# Patient Record
Sex: Male | Born: 1990 | Race: White | Hispanic: No | Marital: Single | Smoking: Former smoker
Health system: Southern US, Community
[De-identification: ages and names within clinical notes are randomized; demographics above are authoritative.]

## PROBLEM LIST (undated history)

## (undated) DIAGNOSIS — F329 Major depressive disorder, single episode, unspecified: Secondary | ICD-10-CM

## (undated) DIAGNOSIS — F909 Attention-deficit hyperactivity disorder, unspecified type: Secondary | ICD-10-CM

## (undated) DIAGNOSIS — F32A Depression, unspecified: Secondary | ICD-10-CM

---

## 2008-09-07 ENCOUNTER — Emergency Department (HOSPITAL_COMMUNITY): Admission: EM | Admit: 2008-09-07 | Discharge: 2008-09-07 | Payer: Self-pay | Admitting: Emergency Medicine

## 2010-11-18 ENCOUNTER — Emergency Department (HOSPITAL_COMMUNITY)
Admission: EM | Admit: 2010-11-18 | Discharge: 2010-11-18 | Disposition: A | Discharge status: Home / Self Care | Payer: Managed Care, Other (non HMO) | Attending: Emergency Medicine | Admitting: Emergency Medicine

## 2010-11-18 DIAGNOSIS — F17200 Nicotine dependence, unspecified, uncomplicated: Secondary | ICD-10-CM | POA: Insufficient documentation

## 2010-11-18 DIAGNOSIS — R059 Cough, unspecified: Secondary | ICD-10-CM | POA: Insufficient documentation

## 2010-11-18 DIAGNOSIS — J40 Bronchitis, not specified as acute or chronic: Secondary | ICD-10-CM | POA: Insufficient documentation

## 2010-11-18 DIAGNOSIS — F988 Other specified behavioral and emotional disorders with onset usually occurring in childhood and adolescence: Secondary | ICD-10-CM | POA: Insufficient documentation

## 2010-11-18 DIAGNOSIS — R05 Cough: Secondary | ICD-10-CM | POA: Insufficient documentation

## 2012-10-28 ENCOUNTER — Emergency Department (HOSPITAL_COMMUNITY): Payer: Self-pay

## 2012-10-28 ENCOUNTER — Encounter (HOSPITAL_COMMUNITY): Payer: Self-pay

## 2012-10-28 ENCOUNTER — Emergency Department (HOSPITAL_COMMUNITY)
Admission: EM | Admit: 2012-10-28 | Discharge: 2012-10-28 | Disposition: A | Discharge status: Home / Self Care | Payer: Self-pay | Attending: Emergency Medicine | Admitting: Emergency Medicine

## 2012-10-28 DIAGNOSIS — R61 Generalized hyperhidrosis: Secondary | ICD-10-CM | POA: Insufficient documentation

## 2012-10-28 DIAGNOSIS — IMO0001 Reserved for inherently not codable concepts without codable children: Secondary | ICD-10-CM | POA: Insufficient documentation

## 2012-10-28 DIAGNOSIS — J029 Acute pharyngitis, unspecified: Secondary | ICD-10-CM

## 2012-10-28 DIAGNOSIS — R110 Nausea: Secondary | ICD-10-CM

## 2012-10-28 DIAGNOSIS — Z8659 Personal history of other mental and behavioral disorders: Secondary | ICD-10-CM | POA: Insufficient documentation

## 2012-10-28 DIAGNOSIS — R109 Unspecified abdominal pain: Secondary | ICD-10-CM

## 2012-10-28 DIAGNOSIS — R197 Diarrhea, unspecified: Secondary | ICD-10-CM

## 2012-10-28 DIAGNOSIS — H53149 Visual discomfort, unspecified: Secondary | ICD-10-CM | POA: Insufficient documentation

## 2012-10-28 DIAGNOSIS — R51 Headache: Secondary | ICD-10-CM | POA: Insufficient documentation

## 2012-10-28 DIAGNOSIS — M791 Myalgia, unspecified site: Secondary | ICD-10-CM

## 2012-10-28 HISTORY — DX: Attention-deficit hyperactivity disorder, unspecified type: F90.9

## 2012-10-28 LAB — CBC WITH DIFFERENTIAL/PLATELET
Basophils Absolute: 0 10*3/uL (ref 0.0–0.1)
Basophils Relative: 0 % (ref 0–1)
Hemoglobin: 15.6 g/dL (ref 13.0–17.0)
Lymphocytes Relative: 9 % — ABNORMAL LOW (ref 12–46)
MCHC: 34.3 g/dL (ref 30.0–36.0)
Monocytes Relative: 6 % (ref 3–12)
Neutro Abs: 12.9 10*3/uL — ABNORMAL HIGH (ref 1.7–7.7)
Neutrophils Relative %: 83 % — ABNORMAL HIGH (ref 43–77)
RBC: 5.21 MIL/uL (ref 4.22–5.81)
WBC: 15.6 10*3/uL — ABNORMAL HIGH (ref 4.0–10.5)

## 2012-10-28 LAB — URINALYSIS, ROUTINE W REFLEX MICROSCOPIC
Glucose, UA: NEGATIVE mg/dL
Specific Gravity, Urine: 1.02 (ref 1.005–1.030)

## 2012-10-28 LAB — COMPREHENSIVE METABOLIC PANEL
ALT: 26 U/L (ref 0–53)
AST: 25 U/L (ref 0–37)
CO2: 23 mEq/L (ref 19–32)
Calcium: 9.2 mg/dL (ref 8.4–10.5)
Sodium: 133 mEq/L — ABNORMAL LOW (ref 135–145)
Total Protein: 8.1 g/dL (ref 6.0–8.3)

## 2012-10-28 LAB — MONONUCLEOSIS SCREEN: Mono Screen: NEGATIVE

## 2012-10-28 LAB — URINE MICROSCOPIC-ADD ON

## 2012-10-28 MED ORDER — KETOROLAC TROMETHAMINE 60 MG/2ML IM SOLN
60.0000 mg | Freq: Once | INTRAMUSCULAR | Status: AC
  Administered 2012-10-28: 60 mg via INTRAMUSCULAR
  Filled 2012-10-28: qty 2

## 2012-10-28 MED ORDER — OSELTAMIVIR PHOSPHATE 75 MG PO CAPS: 75.0000 mg | ORAL_CAPSULE | Freq: Two times a day (BID) | ORAL | Status: DC

## 2012-10-28 MED ORDER — MENTHOL 3 MG MT LOZG
1.0000 | LOZENGE | Freq: Once | OROMUCOSAL | Status: AC
  Administered 2012-10-28: 3 mg via ORAL
  Filled 2012-10-28: qty 9

## 2012-10-28 NOTE — ED Provider Notes (Signed)
History     CSN: [id number]  Arrival date & time 10/28/12  1412   First MD Initiated Contact with Patient 10/28/12 1414      Chief Complaint  Patient presents with  . Generalized Body Aches    HPI Comments: 22 y.o presents with symptoms which started immediately yesterday night.  Body aches, sore throat, abdominal pain (pressure like 4/10 worse with movement), diarrhea.  He also has had more tonsil stones coming up. Symptoms have been present for 1 day.  This am he noted diarrhea.  He has been taking Immodium for diarrhea.  One sick contact (aunt in law who possibly had a cold) . He has not had the flu shot and neither has his pregnant wife  Also mentions recent nose bleeds within last 2 weeks and he has been spiting with specks of blood.   PMH: epistaxis, ADHD  P[facility]: exploratory abdominal surgery (child); colonoscopy age 78 or 1  SH: married; wife pregnant with twins; patient has been cleaning cat liter, works as a Electrical engineer   The history is provided by the patient. No language interpreter was used.    Past Medical History  Diagnosis Date  . ADHD (attention deficit hyperactivity disorder)     History reviewed. No pertinent past surgical history.  No family history on file.  History  Substance Use Topics  . Smoking status: Not on file  . Smokeless tobacco: Not on file  . Alcohol Use: No      Review of Systems  Constitutional: Positive for diaphoresis. Negative for fever, chills and appetite change.       Felt hot   HENT: Positive for sore throat.   Eyes: Positive for photophobia.  Respiratory: Negative for cough.   Gastrointestinal: Positive for nausea, abdominal pain and diarrhea. Negative for vomiting.       Abdominal pain feels like presssure 4/10.    Diarrhea x 1.5 months before sudden onset today  Musculoskeletal: Positive for myalgias.       Shoulders neck (posterior and anterior) sore.   Neurological: Positive for headaches.  All other systems  reviewed and are negative.    Allergies  Review of patient's allergies indicates no known allergies.  Home Medications   Current Outpatient Rx  Name  Route  Sig  Dispense  Refill  . loperamide (IMODIUM) 1 MG/5ML solution   Oral   Take 2 mg by mouth 4 (four) times daily as needed for diarrhea or loose stools.         Marland Kitchen oseltamivir (TAMIFLU) 75 MG capsule   Oral   Take 1 capsule (75 mg total) by mouth 2 (two) times daily.   10 capsule   0     Generic okay     BP 134/80  Pulse 92  Temp(Src) 98.5 F (36.9 C) (Oral)  Resp 19  SpO2 99%  Physical Exam  Nursing note and vitals reviewed. Constitutional: He is oriented to person, place, and time. Vital signs are normal. He appears well-developed and well-nourished. He is cooperative.  HENT:  Head: Normocephalic and atraumatic.  Mouth/Throat: Oropharynx is clear and moist and mucous membranes are normal. No oropharyngeal exudate.  Eyes: Conjunctivae are normal. Pupils are equal, round, and reactive to light. Right eye exhibits no discharge. Left eye exhibits no discharge. No scleral icterus.  Mild photophobia   Neck: Normal range of motion and full passive range of motion without pain.  Cardiovascular: Normal rate, regular rhythm, S1 normal, S2 normal and normal heart  sounds.   No murmur heard. Pulmonary/Chest: Effort normal and breath sounds normal.  Abdominal: Soft. Bowel sounds are normal. He exhibits no distension. There is no tenderness.  Obese ab  Musculoskeletal: He exhibits no edema.  Lymphadenopathy:       Head (right side): No submental, no submandibular, no tonsillar, no preauricular, no posterior auricular and no occipital adenopathy present.       Head (left side): No submental, no submandibular, no tonsillar, no preauricular, no posterior auricular and no occipital adenopathy present.    He has no cervical adenopathy.  Neurological: He is alert and oriented to person, place, and time. He has normal strength.   Skin: Skin is warm, dry and intact. No rash noted.  Psychiatric: He has a normal mood and affect. His speech is normal and behavior is normal. Judgment and thought content normal. Cognition and memory are normal.    ED Course  Procedures (including critical care time)  Labs Reviewed  CBC WITH DIFFERENTIAL - Abnormal; Notable for the following:    WBC 15.6 (*)    Neutrophils Relative 83 (*)    Neutro Abs 12.9 (*)    Lymphocytes Relative 9 (*)    All other components within normal limits  COMPREHENSIVE METABOLIC PANEL - Abnormal; Notable for the following:    Sodium 133 (*)    Glucose, Bld 100 (*)    All other components within normal limits  URINALYSIS, ROUTINE W REFLEX MICROSCOPIC - Abnormal; Notable for the following:    Hgb urine dipstick TRACE (*)    Ketones, ur TRACE (*)    All other components within normal limits  RAPID STREP SCREEN  MONONUCLEOSIS SCREEN  LIPASE, BLOOD  URINE MICROSCOPIC-ADD ON  RAPID HIV SCREEN ([facility])  TOXOPLASMA ANTIBODIES- IGG AND  IGM   Dg Abd Acute W/chest  10/28/2012  *RADIOLOGY REPORT*  Clinical Data: Generalized body aches and diarrhea.  ACUTE ABDOMEN SERIES (ABDOMEN 2 VIEW & CHEST 1 VIEW)  Comparison: None.  Findings: Frontal view of the chest shows midline trachea and normal heart size.  Lungs are clear.  No pleural fluid.  Two views of the abdomen show gas and stool scattered in the colon. There is a relative paucity of small bowel gas.  IMPRESSION: No acute findings.   Original Report Authenticated By: Leanna Battles, M.D.      1. Sore throat   2. Abdominal pain   3. Nausea   4. Diarrhea   5. Myalgia       MDM  Ddx: viral, strep, mono, flu,  less likely toxoplasmosis  CBC, CMET, rapid strep, lipase, mono, UA, HIV, toxo antibodies  Tamiflu 75 mg bid x 5 days    Shirlee Latch MD [phone]        Annett Gula, MD 10/28/12 [phone]

## 2012-10-28 NOTE — ED Provider Notes (Signed)
I saw and evaluated the patient, reviewed the resident's note and I agree with the findings and plan.  Body aches, weakness, sore throat, diarrhea since last night. Intermittent diarrhea for the past month. Nontoxic, afebrile, no meningismus. nonfocal neuro exam. Abdomen soft. Maculopapular erythema to lower back and abdomen. 5/5 strength in bilateral lower extremities. Ankle plantar and dorsiflexion intact. Great toe extension intact bilaterally. +2 DP and PT pulses. +2 patellar reflexes bilaterally. Normal gait.   Glynn Octave, MD 10/28/12 [phone]

## 2012-10-28 NOTE — ED Notes (Signed)
Pt c/o bodyaches, sore throat x2 days

## 2013-06-12 ENCOUNTER — Emergency Department (HOSPITAL_BASED_OUTPATIENT_CLINIC_OR_DEPARTMENT_OTHER)
Admission: EM | Admit: 2013-06-12 | Discharge: 2013-06-12 | Disposition: A | Discharge status: Home / Self Care | Payer: No Typology Code available for payment source | Attending: Emergency Medicine | Admitting: Emergency Medicine

## 2013-06-12 ENCOUNTER — Encounter (HOSPITAL_BASED_OUTPATIENT_CLINIC_OR_DEPARTMENT_OTHER): Payer: Self-pay | Admitting: Emergency Medicine

## 2013-06-12 DIAGNOSIS — S139XXA Sprain of joints and ligaments of unspecified parts of neck, initial encounter: Secondary | ICD-10-CM | POA: Insufficient documentation

## 2013-06-12 DIAGNOSIS — F3289 Other specified depressive episodes: Secondary | ICD-10-CM | POA: Insufficient documentation

## 2013-06-12 DIAGNOSIS — S161XXA Strain of muscle, fascia and tendon at neck level, initial encounter: Secondary | ICD-10-CM

## 2013-06-12 DIAGNOSIS — Y9389 Activity, other specified: Secondary | ICD-10-CM | POA: Insufficient documentation

## 2013-06-12 DIAGNOSIS — X500XXA Overexertion from strenuous movement or load, initial encounter: Secondary | ICD-10-CM | POA: Insufficient documentation

## 2013-06-12 DIAGNOSIS — F909 Attention-deficit hyperactivity disorder, unspecified type: Secondary | ICD-10-CM | POA: Insufficient documentation

## 2013-06-12 DIAGNOSIS — Y929 Unspecified place or not applicable: Secondary | ICD-10-CM | POA: Insufficient documentation

## 2013-06-12 DIAGNOSIS — F329 Major depressive disorder, single episode, unspecified: Secondary | ICD-10-CM | POA: Insufficient documentation

## 2013-06-12 DIAGNOSIS — Z79899 Other long term (current) drug therapy: Secondary | ICD-10-CM | POA: Insufficient documentation

## 2013-06-12 HISTORY — DX: Depression, unspecified: F32.A

## 2013-06-12 HISTORY — DX: Major depressive disorder, single episode, unspecified: F32.9

## 2013-06-12 MED ORDER — CYCLOBENZAPRINE HCL 10 MG PO TABS: 10.0000 mg | ORAL_TABLET | Freq: Three times a day (TID) | ORAL | Status: AC | PRN

## 2013-06-12 MED ORDER — IBUPROFEN 800 MG PO TABS: 800.0000 mg | ORAL_TABLET | Freq: Three times a day (TID) | ORAL | Status: AC

## 2013-06-12 NOTE — ED Notes (Signed)
Patient states that he was twisting his neck two days ago to relieve muscle stress and it started hurting and continues to hurt. Took tylenol no relief

## 2013-06-12 NOTE — ED Provider Notes (Signed)
CSN: [id number]     Arrival date & time 06/12/13  2130 History   First MD Initiated Contact with Patient 06/12/13 [phone]     Chief Complaint  Patient presents with  . Neck Pain   (Consider location/radiation/quality/duration/timing/severity/associated sxs/prior Treatment) Patient is a 22 y.o. male presenting with neck pain.  Neck Pain  Pt reports two days ago after 'cracking' his neck he had onset of moderate constant aching pain in neck, worse with movement and worsening over the last 2 days. Denies any radicular symptoms. Minimal improvement with APAP.   Past Medical History  Diagnosis Date  . ADHD (attention deficit hyperactivity disorder)   . Depression    History reviewed. No pertinent past surgical history. No family history on file. History  Substance Use Topics  . Smoking status: Never Smoker   . Smokeless tobacco: Not on file  . Alcohol Use: No    Review of Systems  Musculoskeletal: Positive for neck pain.   All other systems reviewed and are negative except as noted in HPI.   Allergies  Review of patient's allergies indicates no known allergies.  Home Medications   Current Outpatient Rx  Name  Route  Sig  Dispense  Refill  . amphetamine-dextroamphetamine (ADDERALL XR) 25 MG 24 hr capsule   Oral   Take 25 mg by mouth every morning.         Marland Kitchen buPROPion (WELLBUTRIN) 100 MG tablet   Oral   Take 100 mg by mouth 2 (two) times daily.         Marland Kitchen lamoTRIgine (LAMICTAL) 150 MG tablet   Oral   Take 150 mg by mouth 2 (two) times daily.         Marland Kitchen loperamide (IMODIUM) 1 MG/5ML solution   Oral   Take 2 mg by mouth 4 (four) times daily as needed for diarrhea or loose stools.         Marland Kitchen oseltamivir (TAMIFLU) 75 MG capsule   Oral   Take 1 capsule (75 mg total) by mouth 2 (two) times daily.   10 capsule   0     Generic okay    BP 160/104  Pulse 94  Temp(Src) 98.9 F (37.2 C) (Oral)  Resp 19  SpO2 100% Physical Exam  Nursing note and vitals  reviewed. Constitutional: He is oriented to person, place, and time. He appears well-developed and well-nourished.  HENT:  Head: Normocephalic and atraumatic.  Eyes: EOM are normal. Pupils are equal, round, and reactive to light.  Neck: Normal range of motion. Neck supple.  Cardiovascular: Normal rate, normal heart sounds and intact distal pulses.   Pulmonary/Chest: Effort normal and breath sounds normal.  Abdominal: Bowel sounds are normal. He exhibits no distension. There is no tenderness.  Musculoskeletal: Normal range of motion. He exhibits tenderness (soft tissue tenderness of cervical and upper thoracic areas). He exhibits no edema.  Neurological: He is alert and oriented to person, place, and time. He has normal strength. No cranial nerve deficit or sensory deficit.  Skin: Skin is warm and dry. No rash noted.  Psychiatric: He has a normal mood and affect.    ED Course  Procedures (including critical care time) Labs Review Labs Reviewed - No data to display Imaging Review No results found.  EKG Interpretation   None       MDM   1. Cervical strain, acute, initial encounter     Neck pain, no radicular symptoms and normal neuro exam. Suspect muscle spasm, less likely  disk disease. Advised Motrin/Flexeril. PCP followup if not improving.    Charles B. Bernette Mayers, MD 06/12/13 [phone]

## 2013-09-19 IMAGING — CR DG ABDOMEN ACUTE W/ 1V CHEST
3 series · 3 of 3 positions shown · non-contrast
Comparison: None.

CLINICAL DATA: Generalized body aches and diarrhea.

ACUTE ABDOMEN SERIES (ABDOMEN 2 VIEW & CHEST 1 VIEW)

[w chest pa]
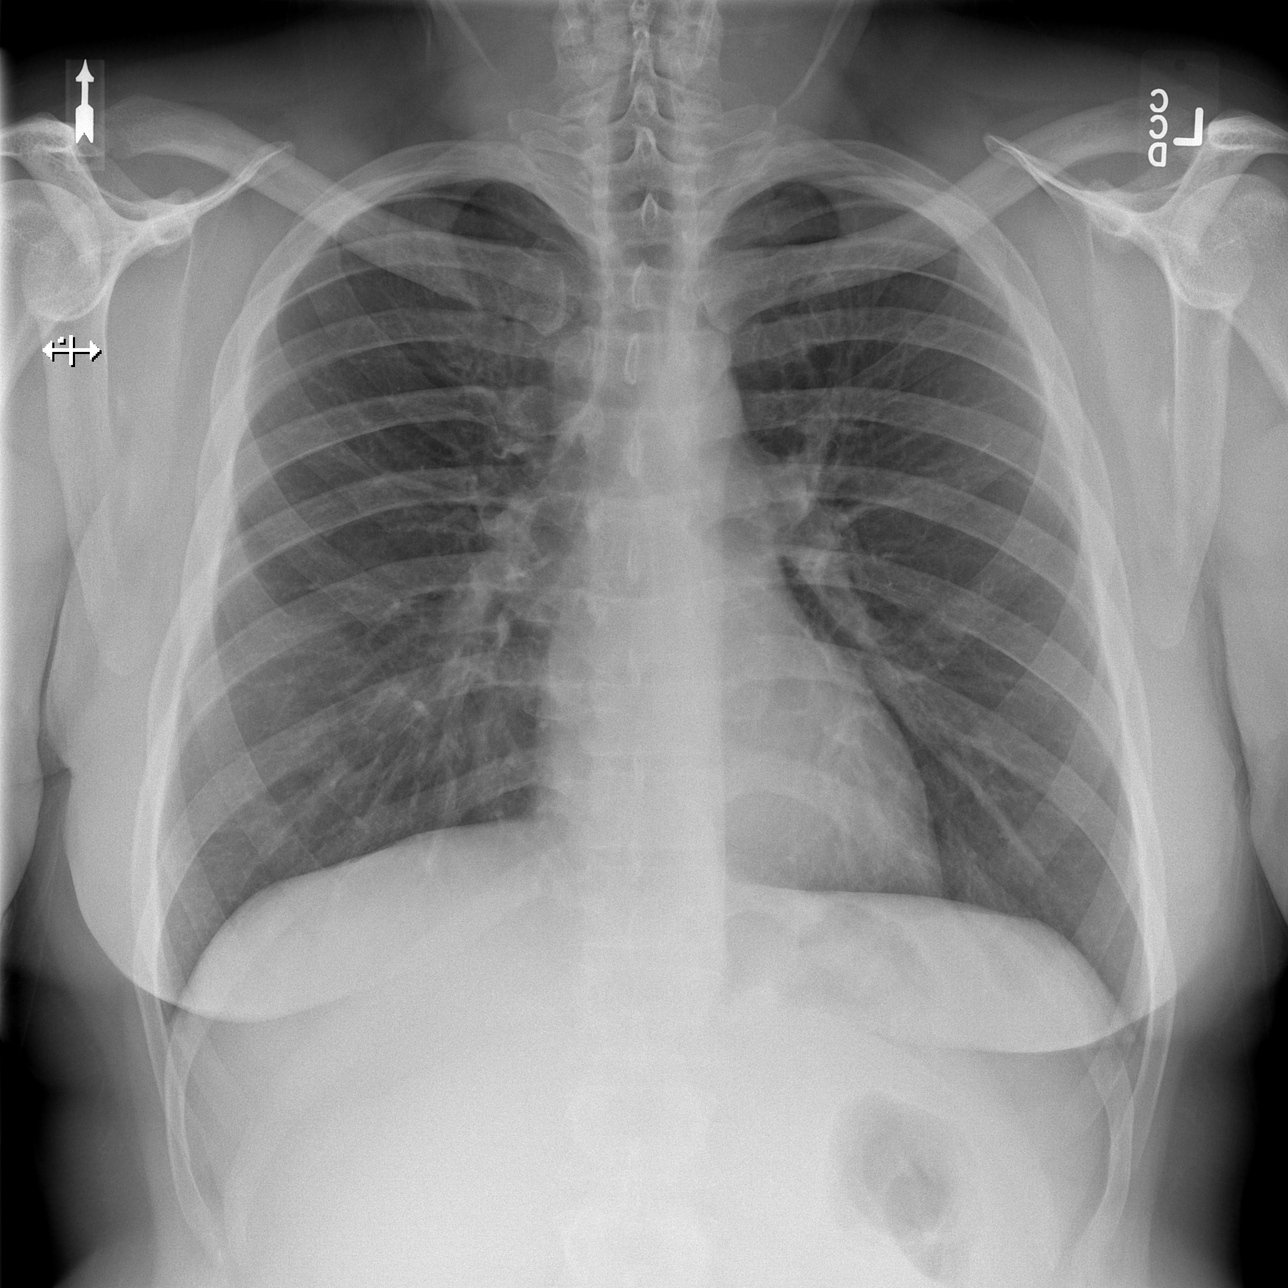

[w abdomen upright]
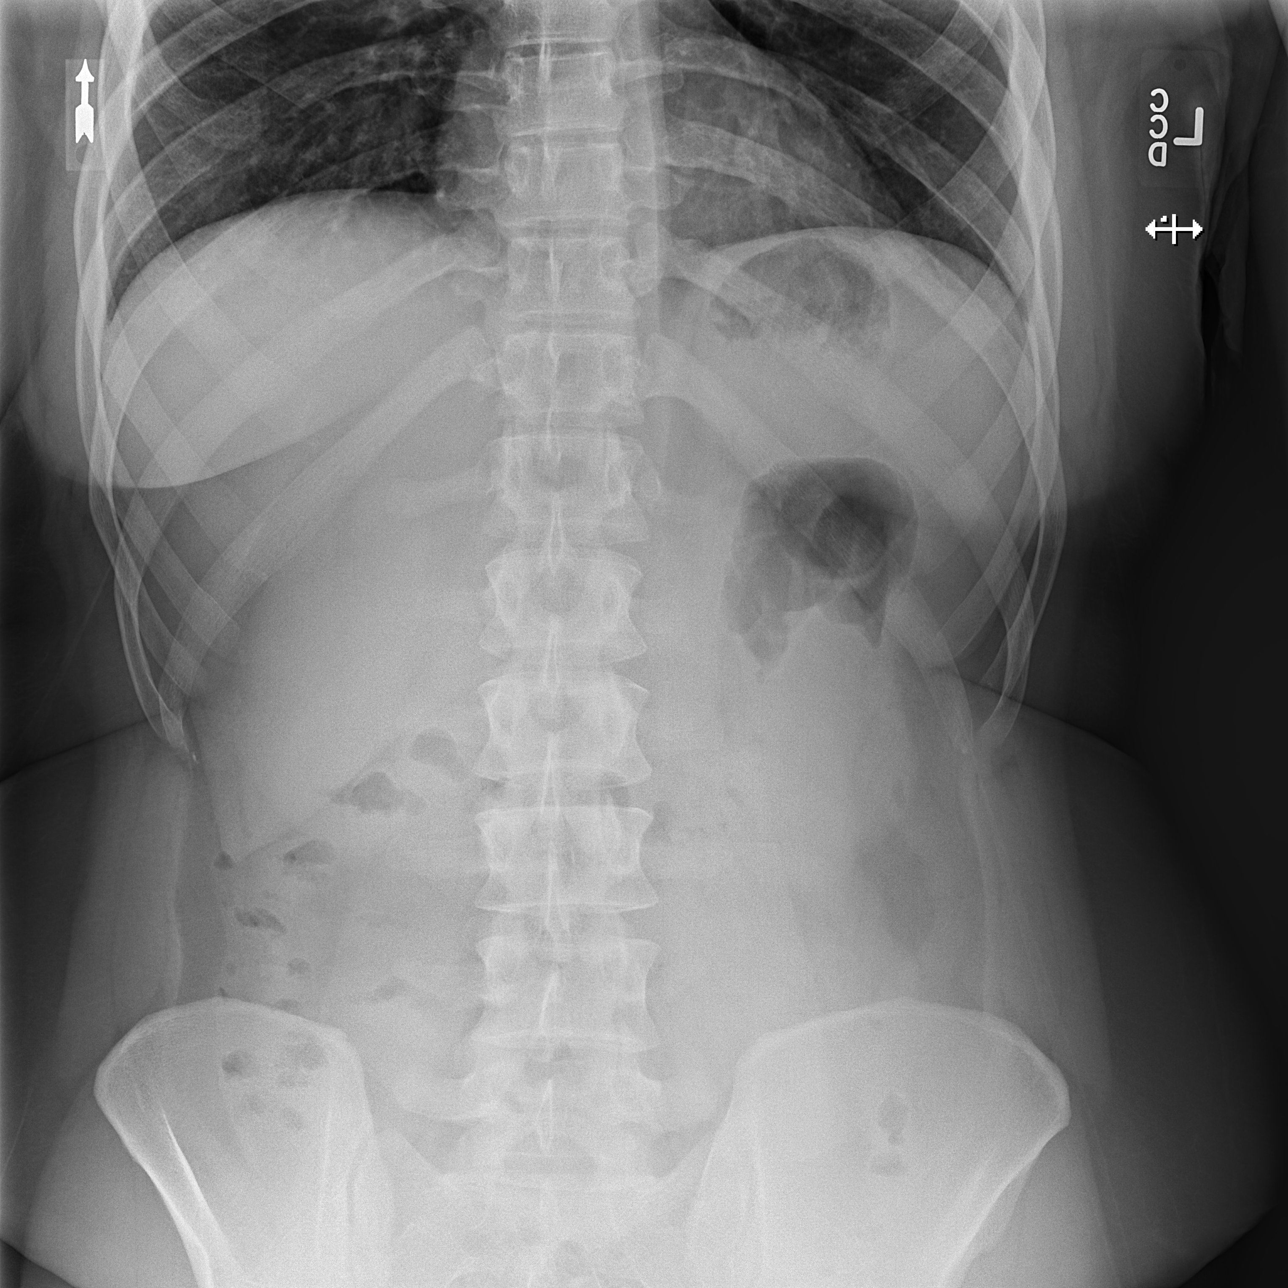

[t abdomen supine]
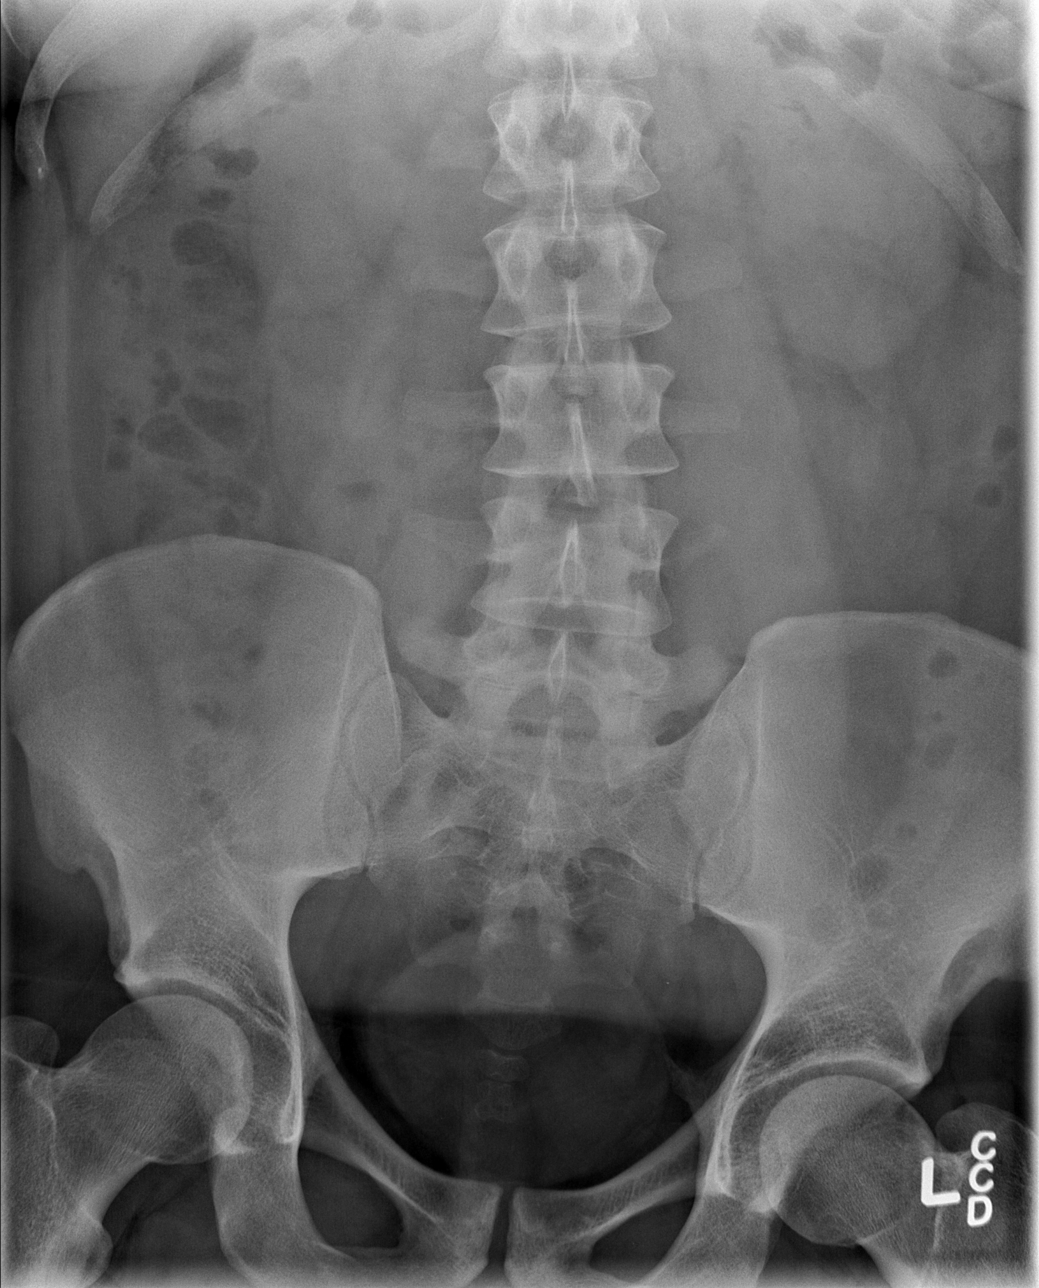

[3 of 3 positions shown; findings below may reference images not displayed]

FINDINGS: Frontal view of the chest shows midline trachea and
normal heart size.  Lungs are clear.  No pleural fluid.

Two views of the abdomen show gas and stool scattered in the colon.
There is a relative paucity of small bowel gas.
IMPRESSION: No acute findings.

## 2014-07-13 ENCOUNTER — Emergency Department (HOSPITAL_COMMUNITY): Payer: Managed Care, Other (non HMO)

## 2014-07-13 ENCOUNTER — Emergency Department (HOSPITAL_COMMUNITY)
Admission: EM | Admit: 2014-07-13 | Discharge: 2014-07-13 | Disposition: A | Discharge status: Home / Self Care | Payer: Managed Care, Other (non HMO) | Attending: Emergency Medicine | Admitting: Emergency Medicine

## 2014-07-13 ENCOUNTER — Encounter (HOSPITAL_COMMUNITY): Payer: Self-pay | Admitting: Emergency Medicine

## 2014-07-13 DIAGNOSIS — Y9289 Other specified places as the place of occurrence of the external cause: Secondary | ICD-10-CM | POA: Insufficient documentation

## 2014-07-13 DIAGNOSIS — Z79899 Other long term (current) drug therapy: Secondary | ICD-10-CM | POA: Insufficient documentation

## 2014-07-13 DIAGNOSIS — F909 Attention-deficit hyperactivity disorder, unspecified type: Secondary | ICD-10-CM | POA: Insufficient documentation

## 2014-07-13 DIAGNOSIS — Y9389 Activity, other specified: Secondary | ICD-10-CM | POA: Insufficient documentation

## 2014-07-13 DIAGNOSIS — Y998 Other external cause status: Secondary | ICD-10-CM | POA: Insufficient documentation

## 2014-07-13 DIAGNOSIS — W228XXA Striking against or struck by other objects, initial encounter: Secondary | ICD-10-CM | POA: Insufficient documentation

## 2014-07-13 DIAGNOSIS — F329 Major depressive disorder, single episode, unspecified: Secondary | ICD-10-CM | POA: Insufficient documentation

## 2014-07-13 DIAGNOSIS — S60221A Contusion of right hand, initial encounter: Secondary | ICD-10-CM

## 2014-07-13 DIAGNOSIS — Z87891 Personal history of nicotine dependence: Secondary | ICD-10-CM | POA: Insufficient documentation

## 2014-07-13 MED ORDER — TRAMADOL HCL 50 MG PO TABS: 50.0000 mg | ORAL_TABLET | Freq: Four times a day (QID) | ORAL | Status: AC | PRN

## 2014-07-13 NOTE — Discharge Instructions (Signed)
Cryotherapy °Cryotherapy means treatment with cold. Ice or gel packs can be used to reduce both pain and swelling. Ice is the most helpful within the first 24 to 48 hours after an injury or flare-up from overusing a muscle or joint. Sprains, strains, spasms, burning pain, shooting pain, and aches can all be eased with ice. Ice can also be used when recovering from surgery. Ice is effective, has very few side effects, and is safe for most people to use. °PRECAUTIONS  °Ice is not a safe treatment option for people with: °· Raynaud phenomenon. This is a condition affecting small blood vessels in the extremities. Exposure to cold may cause your problems to return. °· Cold hypersensitivity. There are many forms of cold hypersensitivity, including: °· Cold urticaria. Red, itchy hives appear on the skin when the tissues begin to warm after being iced. °· Cold erythema. This is a red, itchy rash caused by exposure to cold. °· Cold hemoglobinuria. Red blood cells break down when the tissues begin to warm after being iced. The hemoglobin that carry oxygen are passed into the urine because they cannot combine with blood proteins fast enough. °· Numbness or altered sensitivity in the area being iced. °If you have any of the following conditions, do not use ice until you have discussed cryotherapy with your caregiver: °· Heart conditions, such as arrhythmia, angina, or chronic heart disease. °· High blood pressure. °· Healing wounds or open skin in the area being iced. °· Current infections. °· Rheumatoid arthritis. °· Poor circulation. °· Diabetes. °Ice slows the blood flow in the region it is applied. This is beneficial when trying to stop inflamed tissues from spreading irritating chemicals to surrounding tissues. However, if you expose your skin to cold temperatures for too long or without the proper protection, you can damage your skin or nerves. Watch for signs of skin damage due to cold. °HOME CARE INSTRUCTIONS °Follow  these tips to use ice and cold packs safely. °· Place a dry or damp towel between the ice and skin. A damp towel will cool the skin more quickly, so you may need to shorten the time that the ice is used. °· For a more rapid response, add gentle compression to the ice. °· Ice for no more than 10 to 20 minutes at a time. The bonier the area you are icing, the less time it will take to get the benefits of ice. °· Check your skin after 5 minutes to make sure there are no signs of a poor response to cold or skin damage. °· Rest 20 minutes or more between uses. °· Once your skin is numb, you can end your treatment. You can test numbness by very lightly touching your skin. The touch should be so light that you do not see the skin dimple from the pressure of your fingertip. When using ice, most people will feel these normal sensations in this order: cold, burning, aching, and numbness. °· Do not use ice on someone who cannot communicate their responses to pain, such as small children or people with dementia. °HOW TO MAKE AN ICE PACK °Ice packs are the most common way to use ice therapy. Other methods include ice massage, ice baths, and cryosprays. Muscle creams that cause a cold, tingly feeling do not offer the same benefits that ice offers and should not be used as a substitute unless recommended by your caregiver. °To make an ice pack, do one of the following: °· Place crushed ice or a   bag of frozen vegetables in a sealable plastic bag. Squeeze out the excess air. Place this bag inside another plastic bag. Slide the bag into a pillowcase or place a damp towel between your skin and the bag. °· Mix 3 parts water with 1 part rubbing alcohol. Freeze the mixture in a sealable plastic bag. When you remove the mixture from the freezer, it will be slushy. Squeeze out the excess air. Place this bag inside another plastic bag. Slide the bag into a pillowcase or place a damp towel between your skin and the bag. °SEEK MEDICAL CARE  IF: °· You develop white spots on your skin. This may give the skin a blotchy (mottled) appearance. °· Your skin turns blue or pale. °· Your skin becomes waxy or hard. °· Your swelling gets worse. °MAKE SURE YOU:  °· Understand these instructions. °· Will watch your condition. °· Will get help right away if you are not doing well or get worse. °Document Released: 04/15/2011 Document Revised: 01/03/2014 Document Reviewed: 04/15/2011 °[employer]® Patient Information ©2015 [employer]. This information is not intended to replace advice given to you by your health care provider. Make sure you discuss any questions you have with your health care provider. ° °Contusion °A contusion is a deep bruise. Contusions are the result of an injury that caused bleeding under the skin. The contusion may turn blue, purple, or yellow. Minor injuries will give you a painless contusion, but more severe contusions may stay painful and swollen for a few weeks.  °CAUSES  °A contusion is usually caused by a blow, trauma, or direct force to an area of the body. °SYMPTOMS  °· Swelling and redness of the injured area. °· Bruising of the injured area. °· Tenderness and soreness of the injured area. °· Pain. °DIAGNOSIS  °The diagnosis can be made by taking a history and physical exam. An X-ray, CT scan, or MRI may be needed to determine if there were any associated injuries, such as fractures. °TREATMENT  °Specific treatment will depend on what area of the body was injured. In general, the best treatment for a contusion is resting, icing, elevating, and applying cold compresses to the injured area. Over-the-counter medicines may also be recommended for pain control. Ask your caregiver what the best treatment is for your contusion. °HOME CARE INSTRUCTIONS  °· Put ice on the injured area. °¨ Put ice in a plastic bag. °¨ Place a towel between your skin and the bag. °¨ Leave the ice on for 15-20 minutes, 3-4 times a day, or as directed by your health  care provider. °· Only take over-the-counter or prescription medicines for pain, discomfort, or fever as directed by your caregiver. Your caregiver may recommend avoiding anti-inflammatory medicines (aspirin, ibuprofen, and naproxen) for 48 hours because these medicines may increase bruising. °· Rest the injured area. °· If possible, elevate the injured area to reduce swelling. °SEEK IMMEDIATE MEDICAL CARE IF:  °· You have increased bruising or swelling. °· You have pain that is getting worse. °· Your swelling or pain is not relieved with medicines. °MAKE SURE YOU:  °· Understand these instructions. °· Will watch your condition. °· Will get help right away if you are not doing well or get worse. °Document Released: 05/29/2005 Document Revised: 08/24/2013 Document Reviewed: 06/24/2011 °ExitCare® Patient Information ©2015 [employer]. This information is not intended to replace advice given to you by your health care provider. Make sure you discuss any questions you have with your health care provider. ° °

## 2014-07-13 NOTE — ED Notes (Signed)
Pt reports working on car when he slammed his hand up against part of the car. Pt c/o pain, swelling, and decreased ROM to base of middle finger of R hand. Good cap refill.

## 2014-07-13 NOTE — ED Provider Notes (Signed)
CSN: [id number]     Arrival date & time 07/13/14  1958 History   First MD Initiated Contact with Patient 07/13/14 2010    This chart was scribed for non-physician practitioner, Elpidio AnisShari Reyaan Thoma, working with Rolland PorterMark James, MD by Marica OtterNusrat Rahman, ED Scribe. This patient was seen in room WTR8/WTR8 and the patient's care was started at 8:25 PM.  Chief Complaint  Patient presents with  . Hand Injury   HPI PCP: Sid FalconKALISH, Vasilis J, MD HPI Comments: Duane LusterMichael Fahey is a 23 y.o. male, with medical Hx noted below, who presents to the Emergency Department complaining of traumatic, sudden onset right hand pain with associated swelling and decreased ROM to base of right, middle finger onset this evening when pt slammed his hand against a part of his car as he was working on it. Pt reports an allergy to ibuprofen, however, denies any other medicinal allergies. Pt notes he has a ride home.   Past Medical History  Diagnosis Date  . ADHD (attention deficit hyperactivity disorder)   . Depression    History reviewed. No pertinent past surgical history. No family history on file. History  Substance Use Topics  . Smoking status: Former Smoker    Quit date: 04/12/2014  . Smokeless tobacco: Not on file  . Alcohol Use: Yes     Comment: occasional    Review of Systems  Constitutional: Negative for fever and chills.  Musculoskeletal:       Right hand pain with associated swelling and decreased ROM of right, middle finger.   Skin: Negative for wound.  Neurological: Negative for numbness.  Psychiatric/Behavioral: Negative for confusion.      Allergies  Review of patient's allergies indicates no known allergies.  Home Medications   Prior to Admission medications   Medication Sig Start Date End Date Taking? Authorizing Provider  amphetamine-dextroamphetamine (ADDERALL XR) 25 MG 24 hr capsule Take 25 mg by mouth every morning.    Historical Provider, MD  buPROPion (WELLBUTRIN) 100 MG tablet Take 100 mg by  mouth 2 (two) times daily.    Historical Provider, MD  cyclobenzaprine (FLEXERIL) 10 MG tablet Take 1 tablet (10 mg total) by mouth 3 (three) times daily as needed for muscle spasms. 06/12/13   Charles B. Bernette MayersSheldon, MD  ibuprofen (ADVIL,MOTRIN) 800 MG tablet Take 1 tablet (800 mg total) by mouth 3 (three) times daily. 06/12/13   Charles B. Bernette MayersSheldon, MD  lamoTRIgine (LAMICTAL) 150 MG tablet Take 150 mg by mouth 2 (two) times daily.    Historical Provider, MD   Triage Vitals: BP 149/94 mmHg  Pulse 82  Temp(Src) 98.4 F (36.9 C) (Oral)  Resp 18  Ht 6' (1.829 m)  Wt 280 lb (127.007 kg)  BMI 37.97 kg/m2  SpO2 97% Physical Exam  Constitutional: He is oriented to person, place, and time. He appears well-developed and well-nourished. No distress.  HENT:  Head: Normocephalic and atraumatic.  Eyes: Conjunctivae and EOM are normal.  Musculoskeletal: Normal range of motion.  Right hand mildly swollen over the 3rd dorsal and CP joint. No acute wound. There are well healed suture scars over the same joint. No bony deformities and FROM of all joints of all digits.   Neurological: He is alert and oriented to person, place, and time.  Skin: Skin is warm and dry.  Psychiatric: He has a normal mood and affect. His behavior is normal.  Nursing note and vitals reviewed.   ED Course  Procedures (including critical care time) DIAGNOSTIC STUDIES: Oxygen Saturation  is 97% on RA, nl by my interpretation.    COORDINATION OF CARE: 8:28 PM-Discussed treatment plan which includes imaging with pt at bedside and pt agreed to plan.   Labs Review Labs Reviewed - No data to display  Imaging Review No results found.   EKG Interpretation None     Dg Hand Complete Right  07/13/2014   CLINICAL DATA:  Initial evaluation for acute trauma.  EXAM: RIGHT HAND - COMPLETE 3+ VIEW  COMPARISON:  None.  FINDINGS: There is no evidence of fracture or dislocation. There is no evidence of arthropathy or other focal bone  abnormality. Soft tissues are unremarkable.  IMPRESSION: Negative.   Electronically Signed   By: Rise MuBenjamin  McClintock M.D.   On: 07/13/2014 20:53    MDM   Final diagnoses:  None    1. Contusion, right hand  No bony injury per imaging. Finger splint provided for comfort. Recommended ibuprofen for pain and inflammation.  I personally performed the services described in this documentation, which was scribed in my presence. The recorded information has been reviewed and is accurate.     Arnoldo HookerShari A Shaaron Golliday, PA-C 07/13/14 2336  Rolland PorterMark James, MD 07/23/14 [phone]
